# Patient Record
Sex: Male | Born: 1970 | Race: White | Hispanic: No | Smoking: Former smoker
Health system: Southern US, Community
[De-identification: ages and names within clinical notes are randomized; demographics above are authoritative.]

## PROBLEM LIST (undated history)

## (undated) ENCOUNTER — Ambulatory Visit: Disposition: A | Discharge status: Home / Self Care | Payer: BLUE CROSS/BLUE SHIELD

## (undated) DIAGNOSIS — E162 Hypoglycemia, unspecified: Secondary | ICD-10-CM

## (undated) DIAGNOSIS — F411 Generalized anxiety disorder: Secondary | ICD-10-CM

## (undated) DIAGNOSIS — G4730 Sleep apnea, unspecified: Secondary | ICD-10-CM

## (undated) HISTORY — DX: Generalized anxiety disorder: F41.1

## (undated) HISTORY — DX: Sleep apnea, unspecified: G47.30

## (undated) HISTORY — DX: Hypoglycemia, unspecified: E16.2

---

## 2019-07-14 ENCOUNTER — Other Ambulatory Visit: Payer: Self-pay

## 2019-07-14 DIAGNOSIS — Z20822 Contact with and (suspected) exposure to covid-19: Secondary | ICD-10-CM

## 2019-07-16 LAB — NOVEL CORONAVIRUS, NAA: SARS-CoV-2, NAA: NOT DETECTED

## 2019-09-29 ENCOUNTER — Emergency Department: Payer: BLUE CROSS/BLUE SHIELD

## 2019-09-29 ENCOUNTER — Other Ambulatory Visit: Payer: Self-pay

## 2019-09-29 ENCOUNTER — Encounter: Payer: Self-pay | Admitting: Emergency Medicine

## 2019-09-29 ENCOUNTER — Emergency Department
Admission: EM | Admit: 2019-09-29 | Discharge: 2019-09-29 | Disposition: A | Discharge status: Home / Self Care | Payer: BLUE CROSS/BLUE SHIELD | Attending: Emergency Medicine | Admitting: Emergency Medicine

## 2019-09-29 DIAGNOSIS — R531 Weakness: Secondary | ICD-10-CM | POA: Insufficient documentation

## 2019-09-29 DIAGNOSIS — R42 Dizziness and giddiness: Secondary | ICD-10-CM | POA: Diagnosis not present

## 2019-09-29 LAB — CBC
HCT: 45 % (ref 39.0–52.0)
Hemoglobin: 15.3 g/dL (ref 13.0–17.0)
MCH: 31.1 pg (ref 26.0–34.0)
MCHC: 34 g/dL (ref 30.0–36.0)
MCV: 91.5 fL (ref 80.0–100.0)
Platelets: 209 10*3/uL (ref 150–400)
RBC: 4.92 MIL/uL (ref 4.22–5.81)
RDW: 13 % (ref 11.5–15.5)
WBC: 6.1 10*3/uL (ref 4.0–10.5)
nRBC: 0 % (ref 0.0–0.2)

## 2019-09-29 LAB — URINALYSIS, COMPLETE (UACMP) WITH MICROSCOPIC
Bacteria, UA: NONE SEEN
Bilirubin Urine: NEGATIVE
Glucose, UA: NEGATIVE mg/dL
Hgb urine dipstick: NEGATIVE
Ketones, ur: NEGATIVE mg/dL
Leukocytes,Ua: NEGATIVE
Nitrite: NEGATIVE
Protein, ur: NEGATIVE mg/dL
Specific Gravity, Urine: 1.009 (ref 1.005–1.030)
Squamous Epithelial / HPF: NONE SEEN (ref 0–5)
WBC, UA: NONE SEEN WBC/hpf (ref 0–5)
pH: 7 (ref 5.0–8.0)

## 2019-09-29 LAB — BASIC METABOLIC PANEL
Anion gap: 9 (ref 5–15)
BUN: 19 mg/dL (ref 6–20)
CO2: 24 mmol/L (ref 22–32)
Calcium: 9.8 mg/dL (ref 8.9–10.3)
Chloride: 107 mmol/L (ref 98–111)
Creatinine, Ser: 1.1 mg/dL (ref 0.61–1.24)
GFR calc Af Amer: >60 mL/min (ref >60)
GFR calc non Af Amer: >60 mL/min (ref >60)
Glucose, Bld: 104 mg/dL — ABNORMAL HIGH (ref 70–99)
Potassium: 3.6 mmol/L (ref 3.5–5.1)
Sodium: 140 mmol/L (ref 135–145)

## 2019-09-29 LAB — GLUCOSE, CAPILLARY: Glucose-Capillary: 105 mg/dL — ABNORMAL HIGH (ref 70–99)

## 2019-09-29 LAB — TROPONIN I (HIGH SENSITIVITY): Troponin I (High Sensitivity): <2 ng/L (ref <18)

## 2019-09-29 MED ORDER — LACTATED RINGERS IV BOLUS
1000.0000 mL | Freq: Once | INTRAVENOUS | Status: AC
  Administered 2019-09-29: 1000 mL via INTRAVENOUS

## 2019-09-29 NOTE — ED Triage Notes (Signed)
Pt reports hx of diabetes and this am felt weak, dizzy and his blood sugar was reading low. Pt does not recall what it was and did not bring his meter.

## 2019-09-29 NOTE — ED Notes (Signed)
Pt given ice pack for swelling as line infiltrated. Peripheral IV removed.

## 2019-09-29 NOTE — ED Notes (Signed)
Pt discharged home after verbalizing understanding of discharge instructions; nad noted. 

## 2019-09-29 NOTE — ED Provider Notes (Signed)
[facility] Emergency Department Provider Note   ____________________________________________   First MD Initiated Contact with Patient 09/29/19 [phone]     (approximate)  I have reviewed the triage vital signs and the nursing notes.   HISTORY  Chief Complaint Weakness, Dizziness, and Hypoglycemia    HPI Blake Buchanan is a 48 y.o. male with past medical history of prediabetes and anxiety presents to the ED complaining of weakness and dizziness.  Patient reports that he has been having intermittent episodes of feeling lightheaded, weak, and dizzy over the past couple of days.  Episodes do not seem to be provoked by anything in particular, but he is concerned that he has having "sugar crashes".  He states that he previously has been told he is prediabetic and is not currently taking any medication for it.  He describes numerous similar episodes when he was living in [place], which he was evaluated for and told his blood sugar was normal.  He attempted to eat something this morning but had no improvement in his symptoms.  He states he is otherwise been feeling well recently with no fevers, cough, chest pain, shortness of breath, abdominal pain, dysuria, or hematuria.  He does not currently take any medications.        Past Medical History:  Diagnosis Date  . Diabetes mellitus without complication ([place])     There are no active problems to display for this patient.   History reviewed. No pertinent surgical history.  Prior to Admission medications   Not on File    Allergies Patient has no allergy information on record.  No family history on file.  Social History Social History   Tobacco Use  . Smoking status: Not on file  Substance Use Topics  . Alcohol use: Not on file  . Drug use: Not on file    Review of Systems  Constitutional: No fever/chills Eyes: No visual changes. ENT: No sore throat. Cardiovascular: Denies chest pain.  Respiratory: Denies shortness of breath. Gastrointestinal: No abdominal pain.  No nausea, no vomiting.  No diarrhea.  No constipation. Genitourinary: Negative for dysuria. Musculoskeletal: Negative for back pain. Skin: Negative for rash. Neurological: Negative for headaches, focal weakness or numbness.  Positive for lightheadedness and dizziness.  ____________________________________________   PHYSICAL EXAM:  VITAL SIGNS: ED Triage Vitals  Enc Vitals Group     BP 09/29/19 0923 (!) 105/58     Pulse Rate 09/29/19 0923 75     Resp --      Temp 09/29/19 0923 98.1 F (36.7 C)     Temp Source 09/29/19 0923 Oral     SpO2 09/29/19 0923 97 %     Weight 09/29/19 0920 220 lb (99.8 kg)     Height 09/29/19 0920 5\' 4"  (1.626 m)     Head Circumference --      Peak Flow --      Pain Score 09/29/19 0920 0     Pain Loc --      Pain Edu? --      Excl. in [place]? --     Constitutional: Alert and oriented. Eyes: Conjunctivae are normal. Head: Atraumatic. Nose: No congestion/rhinnorhea. Mouth/Throat: Mucous membranes are moist. Neck: Normal ROM Cardiovascular: Normal rate, regular rhythm. Grossly normal heart sounds. Respiratory: Normal respiratory effort.  No retractions. Lungs CTAB. Gastrointestinal: Soft and nontender. No distention. Genitourinary: deferred Musculoskeletal: No lower extremity tenderness nor edema. Neurologic:  Normal speech and language. No gross focal neurologic deficits are appreciated. Skin:  Skin is warm, dry and intact. No rash noted. Psychiatric: Mood and affect are normal. Speech and behavior are normal.  ____________________________________________   LABS (all labs ordered are listed, but only abnormal results are displayed)  Labs Reviewed  BASIC METABOLIC PANEL - Abnormal; Notable for the following components:      Result Value   Glucose, Bld 104 (*)    All other components within normal limits  URINALYSIS, COMPLETE (UACMP) WITH MICROSCOPIC - Abnormal;  Notable for the following components:   Color, Urine STRAW (*)    APPearance CLEAR (*)    All other components within normal limits  GLUCOSE, CAPILLARY - Abnormal; Notable for the following components:   Glucose-Capillary 105 (*)    All other components within normal limits  CBC  CBG MONITORING, ED  TROPONIN I (HIGH SENSITIVITY)  TROPONIN I (HIGH SENSITIVITY)   ____________________________________________  EKG  ED ECG REPORT I, Chesley Noon, the attending physician, personally viewed and interpreted this ECG.   Date: 09/29/2019  EKG Time: 9:33  Rate: 71  Rhythm: normal sinus rhythm  Axis: Normal  Intervals:none  ST&T Change: ST elevation consistent with benign early repol   PROCEDURES  Procedure(s) performed (including Critical Care):  Procedures   ____________________________________________   INITIAL IMPRESSION / ASSESSMENT AND PLAN / ED COURSE       48 year old male with history of prediabetes and anxiety presents to the ED with intermittent episodes of lightheadedness and generalized weakness over the past couple of weeks.  He describes these episodes as "sugar crashes" but states he only has a history of prediabetes and does not currently take any medications for glycemic control.  Fingerstick glucose was checked here and is normal, will check labs, chest x-ray, and UA.  EKG without acute ischemic changes or evidence of arrhythmia, will continue to monitor patient on heart monitor.  Lab work is unremarkable, no evidence of infection on chest x-ray or UA.  Patient feeling better following IV fluid bolus, will discharge home with plan for PCP follow-up.  Counseled patient to return to the ED for new or worsening symptoms, patient agrees with plan.      ____________________________________________   FINAL CLINICAL IMPRESSION(S) / ED DIAGNOSES  Final diagnoses:  Lightheadedness  Generalized weakness     ED Discharge Orders    None       Note:  This  document was prepared using Dragon voice recognition software and may include unintentional dictation errors.   Chesley Noon, MD 09/29/19 1544

## 2019-09-29 NOTE — ED Notes (Signed)
Pt denies pain to infiltration site.

## 2019-11-16 ENCOUNTER — Ambulatory Visit: Payer: BLUE CROSS/BLUE SHIELD | Attending: Internal Medicine

## 2019-11-16 DIAGNOSIS — Z20822 Contact with and (suspected) exposure to covid-19: Secondary | ICD-10-CM

## 2019-11-17 LAB — NOVEL CORONAVIRUS, NAA: SARS-CoV-2, NAA: NOT DETECTED

## 2019-11-22 ENCOUNTER — Ambulatory Visit
Admission: EM | Admit: 2019-11-22 | Discharge: 2019-11-22 | Disposition: A | Discharge status: Home / Self Care | Payer: 59 | Attending: Emergency Medicine | Admitting: Emergency Medicine

## 2019-11-22 ENCOUNTER — Encounter: Payer: Self-pay | Admitting: Emergency Medicine

## 2019-11-22 DIAGNOSIS — R300 Dysuria: Secondary | ICD-10-CM | POA: Insufficient documentation

## 2019-11-22 DIAGNOSIS — Z202 Contact with and (suspected) exposure to infections with a predominantly sexual mode of transmission: Secondary | ICD-10-CM | POA: Diagnosis present

## 2019-11-22 LAB — POCT URINALYSIS DIP (MANUAL ENTRY)
Bilirubin, UA: NEGATIVE
Blood, UA: NEGATIVE
Glucose, UA: NEGATIVE mg/dL
Ketones, POC UA: NEGATIVE mg/dL
Leukocytes, UA: NEGATIVE
Nitrite, UA: NEGATIVE
Protein Ur, POC: NEGATIVE mg/dL
Spec Grav, UA: 1.025 (ref 1.010–1.025)
Urobilinogen, UA: 0.2 E.U./dL
pH, UA: 7 (ref 5.0–8.0)

## 2019-11-22 NOTE — ED Notes (Signed)
Patient in office today c/o frequent urination,odor and burning after urinating. Since 11/15/19.  

## 2019-11-22 NOTE — ED Provider Notes (Signed)
[name]    CSN: [id number] Arrival date & time: 11/22/19  1056      History   Chief Complaint Chief Complaint  Patient presents with  . Urinary Tract Infection    HPI Blake Buchanan is a 49 y.o. male.   Patient presents with dysuria, malodorous urine, and frequency x 2 days.  He denies fever, chills, abdominal pain, back pain, penile discharge, testicular pain, lesions, rash, or other symptoms.  No treatments attempted at home.  Patient states he has not been sexually active for >2 months.    The history is provided by the patient.    Past Medical History:  Diagnosis Date  . Diabetes mellitus without complication (HCC)     There are no problems to display for this patient.   History reviewed. No pertinent surgical history.     Home Medications    Prior to Admission medications   Medication Sig Start Date End Date Taking? Authorizing Provider  omeprazole (PRILOSEC) 40 MG capsule Take 40 mg by mouth daily. 10/07/19   [provider]    Family History History reviewed. No pertinent family history.  Social History Social History   Tobacco Use  . Smoking status: Never Smoker  . Smokeless tobacco: Never Used  Substance Use Topics  . Alcohol use: Not on file  . Drug use: Not on file     Allergies   Patient has no known allergies.   Review of Systems Review of Systems  Constitutional: Negative for chills and fever.  HENT: Negative for ear pain and sore throat.   Eyes: Negative for pain and visual disturbance.  Respiratory: Negative for cough and shortness of breath.   Cardiovascular: Negative for chest pain and palpitations.  Gastrointestinal: Negative for abdominal pain and vomiting.  Genitourinary: Positive for dysuria and frequency. Negative for discharge, hematuria and testicular pain.  Musculoskeletal: Negative for arthralgias and back pain.  Skin: Negative for color change and rash.  Neurological: Negative for seizures  and syncope.  All other systems reviewed and are negative.    Physical Exam Triage Vital Signs ED Triage Vitals  Enc Vitals Group     BP      Pulse      Resp      Temp      Temp src      SpO2      Weight      Height      Head Circumference      Peak Flow      Pain Score      Pain Loc      Pain Edu?      Excl. in [place]?    No data found.  Updated Vital Signs BP 108/74 (BP Location: Left Arm)   Pulse 79   Temp 98.7 F (37.1 C) (Oral)   Resp 18   SpO2 96%   Visual Acuity Right Eye Distance:   Left Eye Distance:   Bilateral Distance:    Right Eye Near:   Left Eye Near:    Bilateral Near:     Physical Exam Vitals and nursing note reviewed.  Constitutional:      Appearance: He is well-developed.  HENT:     Head: Normocephalic and atraumatic.     Mouth/Throat:     Mouth: Mucous membranes are moist.  Eyes:     Conjunctiva/sclera: Conjunctivae normal.  Cardiovascular:     Rate and Rhythm: Normal rate and regular rhythm.     Heart  sounds: No murmur.  Pulmonary:     Effort: Pulmonary effort is normal. No respiratory distress.     Breath sounds: Normal breath sounds.  Abdominal:     General: Bowel sounds are normal.     Palpations: Abdomen is soft.     Tenderness: There is no abdominal tenderness. There is no right CVA tenderness, left CVA tenderness, guarding or rebound.  Genitourinary:    Penis: Normal.      Testes: Normal.  Musculoskeletal:     Cervical back: Neck supple.  Skin:    General: Skin is warm and dry.  Neurological:     General: No focal deficit present.     Mental Status: He is alert and oriented to person, place, and time.  Psychiatric:        Mood and Affect: Mood normal.        Behavior: Behavior normal.      UC Treatments / Results  Labs (all labs ordered are listed, but only abnormal results are displayed) Labs Reviewed  POCT URINALYSIS DIP (MANUAL ENTRY)  CYTOLOGY, (ORAL, ANAL, URETHRAL) ANCILLARY ONLY     EKG   Radiology No results found.  Procedures Procedures (including critical care time)  Medications Ordered in UC Medications - No data to display  Initial Impression / Assessment and Plan / UC Course  I have reviewed the triage vital signs and the nursing notes.  Pertinent labs & imaging results that were available during my care of the patient were reviewed by me and considered in my medical decision making (see chart for details).    Dysuria, possible exposure to STD.  Urethral swab obtained.  Patient declines treatment today and states he will accept treatment if the STD test results come back positive.  Discussed with patient that we will call if if his STD test results are positive and that he and his sexual partners may require treatment at that time.  Instructed patient to refrain from sexual activity until the test results are back.  Patient agrees to plan of care.     Final Clinical Impressions(s) / UC Diagnoses   Final diagnoses:  Dysuria  Possible exposure to STD     Discharge Instructions     Do not have sex until your test results are back.    Your STD tests are pending.  If your test results are positive, we will call you.  You may need treatment and your partner(s) may also need treatment.          ED Prescriptions    None     PDMP not reviewed this encounter.   Sharion Balloon, NP 11/22/19 1212

## 2019-11-22 NOTE — ED Triage Notes (Signed)
Patient in office today c/o frequent urination,odor and burning after urinating. Since 11/15/19.

## 2019-11-22 NOTE — Discharge Instructions (Addendum)
Do not have sex until your test results are back.  Your STD tests are pending.  If your test results are positive, we will call you.  You may need treatment and your partner(s) may also need treatment.      

## 2019-11-23 LAB — CYTOLOGY, (ORAL, ANAL, URETHRAL) ANCILLARY ONLY
Chlamydia: NEGATIVE
Neisseria Gonorrhea: NEGATIVE
Trichomonas: NEGATIVE

## 2019-11-28 ENCOUNTER — Encounter: Payer: Self-pay | Admitting: Family

## 2019-11-28 ENCOUNTER — Ambulatory Visit (INDEPENDENT_AMBULATORY_CARE_PROVIDER_SITE_OTHER): Payer: 59 | Admitting: Family

## 2019-11-28 ENCOUNTER — Other Ambulatory Visit: Payer: Self-pay

## 2019-11-28 VITALS — HR 79 | Ht 70.0 in | Wt 222.0 lb

## 2019-11-28 DIAGNOSIS — F411 Generalized anxiety disorder: Secondary | ICD-10-CM

## 2019-11-28 DIAGNOSIS — G4733 Obstructive sleep apnea (adult) (pediatric): Secondary | ICD-10-CM

## 2019-11-28 DIAGNOSIS — R350 Frequency of micturition: Secondary | ICD-10-CM

## 2019-11-28 DIAGNOSIS — E161 Other hypoglycemia: Secondary | ICD-10-CM | POA: Insufficient documentation

## 2019-11-28 MED ORDER — ALPRAZOLAM 0.5 MG PO TABS: 0.5000 mg | ORAL_TABLET | Freq: Every evening | ORAL | 0 refills | Status: AC | PRN

## 2019-11-28 NOTE — Assessment & Plan Note (Addendum)
Etiology unknown. patient has labs done with prior PCP , he declines labs at this time.  Advised untreated OSA may contributory H/o prediabetes Referral to urology for further evaluation.

## 2019-11-28 NOTE — Assessment & Plan Note (Signed)
Advised of risks of not wearing CPAP machine.  Patient will get supplies

## 2019-11-28 NOTE — Progress Notes (Signed)
Virtual Visit via Video Note  I connected with@  on 11/28/19 at 11:00 AM EST by a video enabled telemedicine application and verified that I am speaking with the correct person using two identifiers.  Location patient: home Location provider:home office Persons participating in the virtual visit: patient, provider  I discussed the limitations of evaluation and management by telemedicine and the availability of in person appointments. The patient expressed understanding and agreed to proceed.   HPI: New patient, establish care.  From [facility] and moved here after lost job, divorce. 'Starting over. ' Living with his parents currently. Employed by [employer]  CC: urinary frequency x for past year, worsening.   Drinks 'nothing but water'. Concerned for urinary frequency, more notable in the evening. Every hour has to urinate.  No penile discharge, testicular pain or concern for STDs. Some urinary hesitancy. No constipation, straining with BM, pain with bowel movement. Had dysuria last week ( when seen at [facility])  which resolved with increase in water intake.  States that he Had CPE last year in [place] around 11/2018 with prostate exam which was normal.   Seen recently at ED, no STDs, infection.   OSA- formally diagnosed. Has cipap. No supplies now. Plans to get supplies.   H/o prediabetic and hypoglycemic episodes. Has a glucometer, will take it when 'feels low' . About a week ago, was 21. Ate sugar and it improved.  Has to eat every couple of hours. Thinks related to anxiety. Carries glucose tablets. 'feels like anxiety is related'. Has seen endocrine for this in [place].   H/o GAD- doesn't want an SSRI due to sexual side effects. Xanax very prn , works well. Same bottle of xanax for over a year.  Breathing exercises and meditation, CBT helpful.  No si/hi. 'suicide is not an option.' No suicide plan. No self harm.  Suicide attempt when in highschool with aspirin.   ROS: See pertinent positives and negatives  per HPI.  Past Medical History:  Diagnosis Date  . GAD (generalized anxiety disorder)   . Hypoglycemia   . Sleep apnea     History reviewed. No pertinent surgical history.  Family History  Problem Relation Age of Onset  . Hyperlipidemia Mother   . Asthma Father   . Hyperlipidemia Father   . Parkinson's disease Maternal Grandmother   . Diabetes Maternal Grandfather   . Hypertension Maternal Grandfather   . Hypertension Paternal Grandfather   . Prostate cancer Neg Hx   . Colon cancer Neg Hx     SOCIAL HX: former smoker   Current Outpatient Medications:  .  ALPRAZolam (XANAX) 0.5 MG tablet, Take 1 tablet (0.5 mg total) by mouth at bedtime as needed for anxiety., Disp: 30 tablet, Rfl: 0 .  omeprazole (PRILOSEC) 40 MG capsule, Take 40 mg by mouth daily., Disp: , Rfl:   EXAM:  VITALS per patient if applicable:  GENERAL: alert, oriented, appears well and in no acute distress  HEENT: atraumatic, conjunttiva clear, no obvious abnormalities on inspection of external nose and ears  NECK: normal movements of the head and neck  LUNGS: on inspection no signs of respiratory distress, breathing rate appears normal, no obvious gross SOB, gasping or wheezing  CV: no obvious cyanosis  MS: moves all visible extremities without noticeable abnormality  PSYCH/NEURO: pleasant and cooperative, no obvious depression or anxiety, speech and thought processing grossly intact  ASSESSMENT AND PLAN:  Discussed the following assessment and plan:  GAD (generalized anxiety disorder) - Plan: [place] counseling- Ambulatory  referral to Psychology, ALPRAZolam Prudy Feeler) 0.5 MG tablet  Urinary frequency - Plan: Ambulatory referral to Urology  Hypoglycemic reaction - Plan: Referral to Nutrition and Diabetes Services  OSA (obstructive sleep apnea) Problem List Items Addressed This Visit      Respiratory   OSA (obstructive sleep apnea)    Advised of risks of not wearing CPAP machine.  Patient  will get supplies        Digestive   Hypoglycemic reaction    Etiology nonspecific at this time.  Patient feels anxiety is contributory .patient has history of prediabetes.  He carries glucose tablets and is working on eating more frequently advised CGM.  Declines endocrine consult at this time. Advised nutrition consult and frequent meals which include protein and carbohydrate to stabilize his blood glucose.  Close follow up.       Relevant Orders   Referral to Nutrition and Diabetes Services     Other   GAD (generalized anxiety disorder) - Primary    Controlled.  He is hesitant to trial an SSRI at this time due to sexual side effects .  He feels well on current regimen although is interested in resuming counseling with CBT.  This referral is in place for him today.  Long discussion regards the risk of benzodiazepines particularly overdose, addiction and cognitive decline.  We jointly agreed that he will  using very sparingly and would continue to do so.  If at any point he feels he is having to use daily, he understands let me know so we can discuss a safer daily medication.  No si or suicide plan. I looked up patient on [place] Controlled Substances Reporting System in [place] and saw no activity that raised concern of inappropriate use.  He will complete a controlled substance form when he comes in the office.  He understands to follow-up every 3 months       Relevant Medications   ALPRAZolam (XANAX) 0.5 MG tablet   Other Relevant Orders   [place] counseling- Ambulatory referral to Psychology   Urinary frequency    Etiology unknown. patient has labs done with prior PCP , he declines labs at this time.  Advised untreated OSA may contributory H/o prediabetes Referral to urology for further evaluation.        Relevant Orders   Ambulatory referral to Urology      -we discussed possible serious and likely etiologies, options for evaluation and workup, limitations of telemedicine visit  vs in person visit, treatment, treatment risks and precautions. Pt prefers to treat via telemedicine empirically rather then risking or undertaking an in person visit at this moment. Patient agrees to seek prompt in person care if worsening, new symptoms arise, or if is not improving with treatment.   I discussed the assessment and treatment plan with the patient. The patient was provided an opportunity to ask questions and all were answered. The patient agreed with the plan and demonstrated an understanding of the instructions.   The patient was advised to call back or seek an in-person evaluation if the symptoms worsen or if the condition fails to improve as anticipated.   Rennie Plowman, FNP

## 2019-11-28 NOTE — Assessment & Plan Note (Signed)
Controlled.  He is hesitant to trial an SSRI at this time due to sexual side effects .  He feels well on current regimen although is interested in resuming counseling with CBT.  This referral is in place for him today.  Long discussion regards the risk of benzodiazepines particularly overdose, addiction and cognitive decline.  We jointly agreed that he will  using very sparingly and would continue to do so.  If at any point he feels he is having to use daily, he understands let me know so we can discuss a safer daily medication.  No si or suicide plan. I looked up patient on [place] Controlled Substances Reporting System in [place] and saw no activity that raised concern of inappropriate use.  He will complete a controlled substance form when he comes in the office.  He understands to follow-up every 3 months

## 2019-11-28 NOTE — Assessment & Plan Note (Addendum)
Etiology nonspecific at this time.  Patient feels anxiety is contributory .patient has history of prediabetes.  He carries glucose tablets and is working on eating more frequently advised CGM.  Declines endocrine consult at this time. Advised nutrition consult and frequent meals which include protein and carbohydrate to stabilize his blood glucose.  Close follow up.

## 2019-12-28 ENCOUNTER — Encounter: Payer: Self-pay | Admitting: Urology

## 2019-12-28 ENCOUNTER — Ambulatory Visit: Payer: 59 | Admitting: Urology

## 2019-12-28 ENCOUNTER — Other Ambulatory Visit: Payer: Self-pay

## 2019-12-28 VITALS — BP 123/82 | HR 73 | Ht 70.0 in | Wt 226.0 lb

## 2019-12-28 DIAGNOSIS — R350 Frequency of micturition: Secondary | ICD-10-CM | POA: Diagnosis not present

## 2019-12-28 LAB — URINALYSIS, COMPLETE
Bilirubin, UA: NEGATIVE
Glucose, UA: NEGATIVE
Ketones, UA: NEGATIVE
Leukocytes,UA: NEGATIVE
Nitrite, UA: NEGATIVE
Protein,UA: NEGATIVE
RBC, UA: NEGATIVE
Specific Gravity, UA: 1.025 (ref 1.005–1.030)
Urobilinogen, Ur: 0.2 mg/dL (ref 0.2–1.0)
pH, UA: 5.5 (ref 5.0–7.5)

## 2019-12-28 LAB — MICROSCOPIC EXAMINATION
Bacteria, UA: NONE SEEN
Epithelial Cells (non renal): NONE SEEN /hpf (ref 0–10)
RBC, Urine: NONE SEEN /hpf (ref 0–2)
WBC, UA: NONE SEEN /hpf (ref 0–5)

## 2019-12-28 LAB — BLADDER SCAN AMB NON-IMAGING

## 2019-12-28 MED ORDER — MIRABEGRON ER 50 MG PO TB24: 50.0000 mg | ORAL_TABLET | Freq: Every day | ORAL | 0 refills | Status: AC

## 2019-12-28 NOTE — Addendum Note (Signed)
Addended by: Frankey Shown on: 12/28/2019 02:04 PM   Modules accepted: Orders

## 2019-12-28 NOTE — Patient Instructions (Signed)

## 2019-12-28 NOTE — Progress Notes (Signed)
12/28/19 1:51 PM   Blake Buchanan Dec 06, 1970 [id number]  Referring provider: Alan Mulder, MD [address] [place],  [place] [address]  CC: Urinary frequency  HPI: I saw Blake Buchanan in urology clinic today for evaluation of urinary frequency.  He is a 49 year old male with history of anxiety who reports worsening urinary frequency and urgency over the last year.  He has had extensive work-up with multiple urine samples and STD testing which have all been negative.  He has also been drinking large amounts of fluids with at least 8 bottles of water a day because he was told to increase his fluids by urgent care.  He denies any gross hematuria, dysuria, abdominal, or flank pain.  He denies any history of retention or UTIs.  There is no family history of prostate cancer.  He is also had a very stressful year as he lost his job in [place] secondary to [name] and is also going through a divorce.  Urinalysis today is completely benign with 0 WBCs, 0 RBCs, no bacteria, nitrite negative, no leukocytes.  PVR is normal at 15 mL.   PMH: Past Medical History:  Diagnosis Date  . GAD (generalized anxiety disorder)   . Hypoglycemia   . Sleep apnea     Surgical History: No past surgical history on file.  Allergies: No Known Allergies  Family History: Family History  Problem Relation Age of Onset  . Hyperlipidemia Mother   . Asthma Father   . Hyperlipidemia Father   . Parkinson's disease Maternal Grandmother   . Diabetes Maternal Grandfather   . Hypertension Maternal Grandfather   . Hypertension Paternal Grandfather   . Prostate cancer Neg Hx   . Colon cancer Neg Hx     Social History:  reports that he has quit smoking. He has never used smokeless tobacco. He reports current alcohol use. He reports that he does not use drugs.  ROS: Please see flowsheet from today's date for complete review of systems.  Physical Exam: BP 123/82 (BP Location: Left Arm, Patient Position:  Sitting, Cuff Size: Large)   Pulse 73   Ht 5\' 10"  (1.778 m)   Wt 226 lb (102.5 kg)   BMI 32.43 kg/m    Constitutional:  Alert and oriented, No acute distress. Cardiovascular: No clubbing, cyanosis, or edema. Respiratory: Normal respiratory effort, no increased work of breathing. GI: Abdomen is soft, nontender, nondistended, no abdominal masses GU: Circumcised phallus with widely patent meatus, testicles descended and 20 cc bilaterally Lymph: No cervical or inguinal lymphadenopathy. Skin: No rashes, bruises or suspicious lesions. Neurologic: Grossly intact, no focal deficits, moving all 4 extremities. Psychiatric: Normal mood and affect.  Laboratory Data: Reviewed, see HPI  Assessment & Plan:   In summary, he is a 49 year old male with medical history notable for anxiety who has had worsening urinary frequency over the last year.  His urinalysis is benign and he is emptying his bladder well with a PVR of 13 mL.  We had a long conversation today that I highly suspect the etiology of his urinary symptoms are behavioral with his increased fluid intake, coffee, and tea, as well as his anxiety and stressful life situation with recent job loss and divorce.  We discussed avoiding bladder irritants like coffee and tea, drinking appropriate amount of fluids when thirsty, and timed and double voiding before bed.  As he transitions to these behavioral strategies, I also recommended a 1 month course of Myrbetriq 50 mg daily to help ease his transition  and improve his urinary frequency.  Behavioral strategies discussed at length 1 month trial of Myrbetriq, samples given RTC for virtual visit 1 month  I spent 30 minutes on the day of the encounter including pre-visit review of the medical record, face-to-face time with the patient, and post visit ordering of labs/imaging/tests.  Nickolas Madrid, MD 12/28/2019  [facility] [address], [place], [place]  [address] [phone]

## 2020-02-02 ENCOUNTER — Telehealth: Payer: 59 | Admitting: Urology

## 2020-02-27 ENCOUNTER — Ambulatory Visit: Payer: 59 | Admitting: Family

## 2020-06-18 IMAGING — CR DG CHEST 2V
2 series · 2 of 2 positions shown · non-contrast
Comparison: None.

CLINICAL DATA: Pt reports hx of diabetes and this am felt weak,
dizzy and his blood sugar was reading low; states his legs felt very
weak and he was trying to [employer] eggs and couldn't remember how;

EXAM:
CHEST - 2 VIEW

[chest pa]
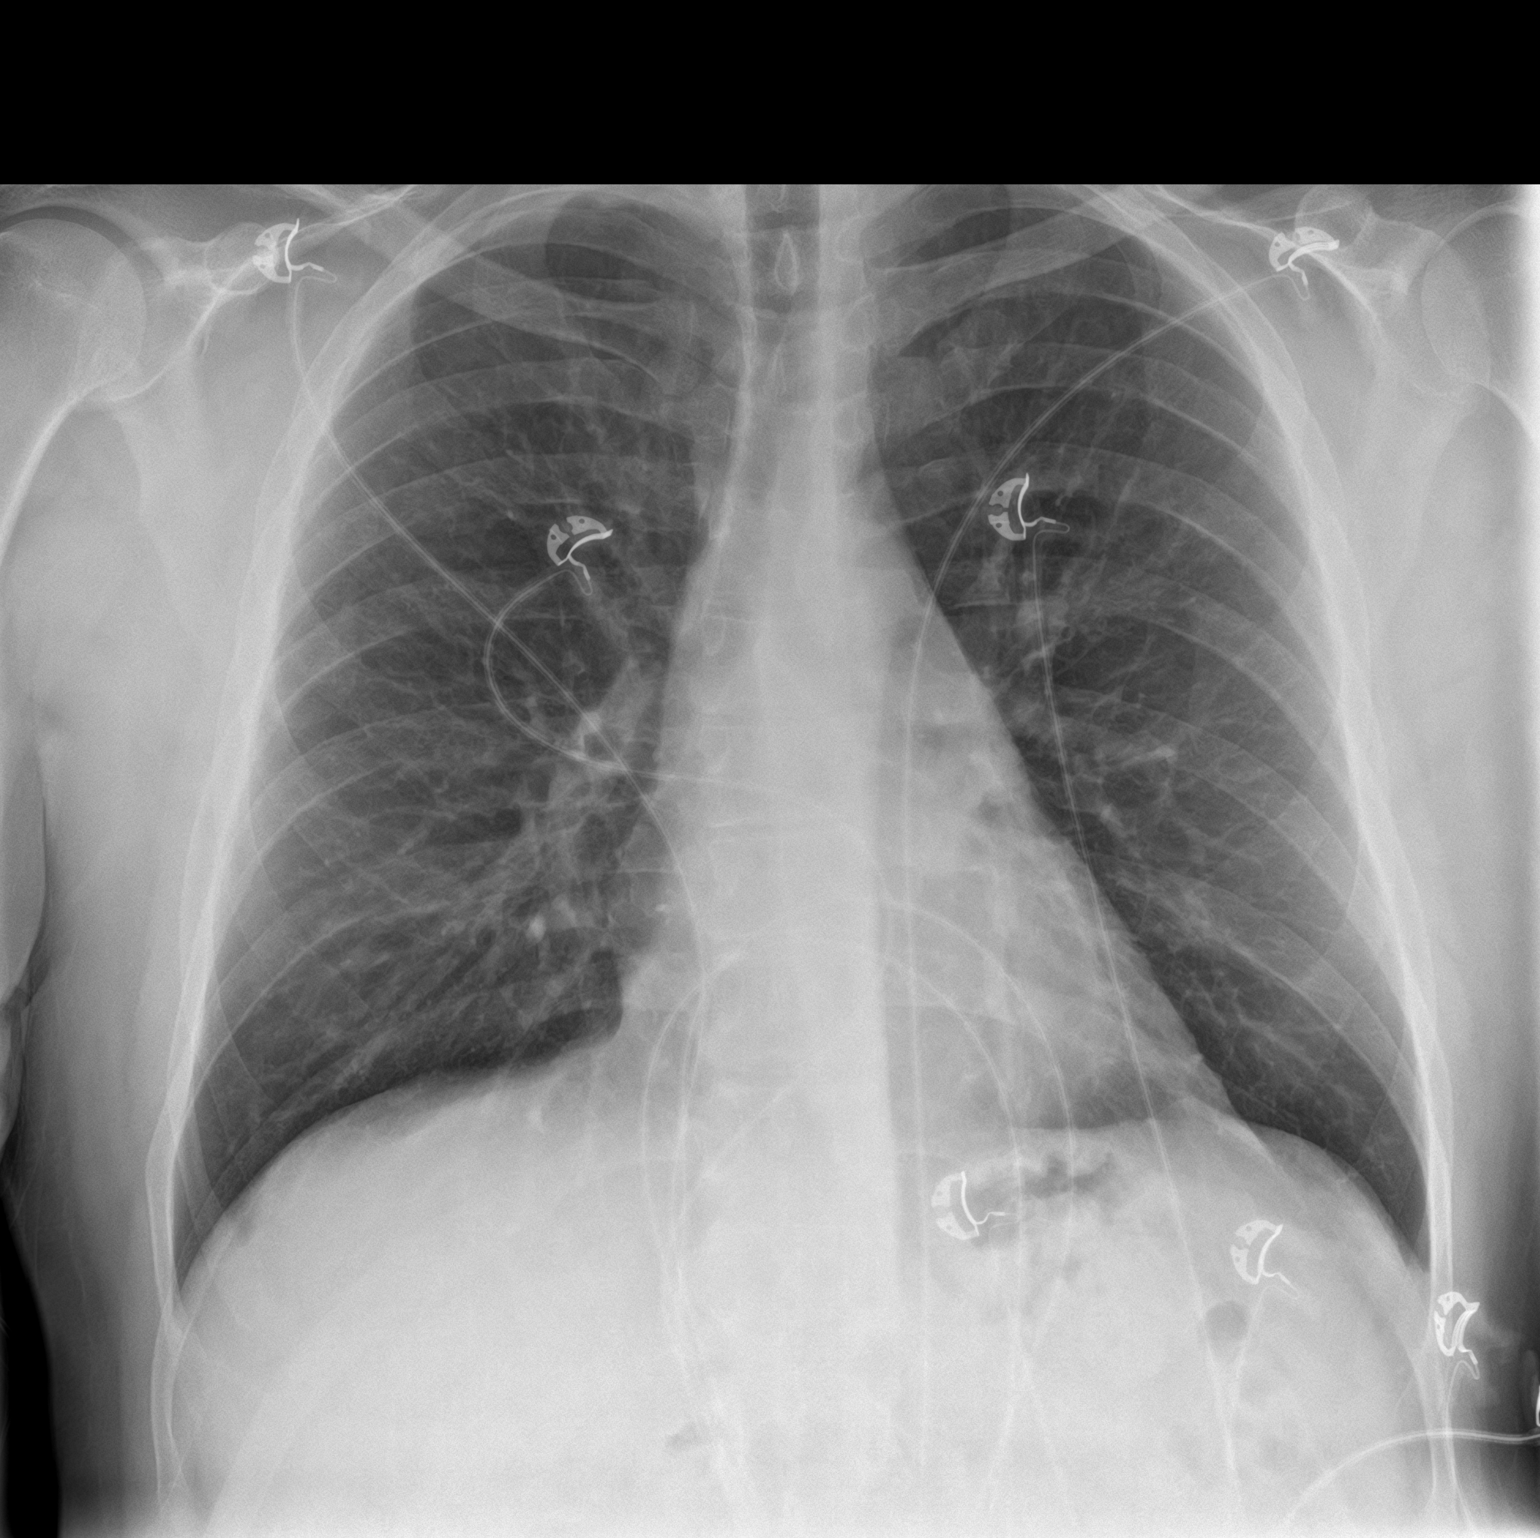

[chest lat]
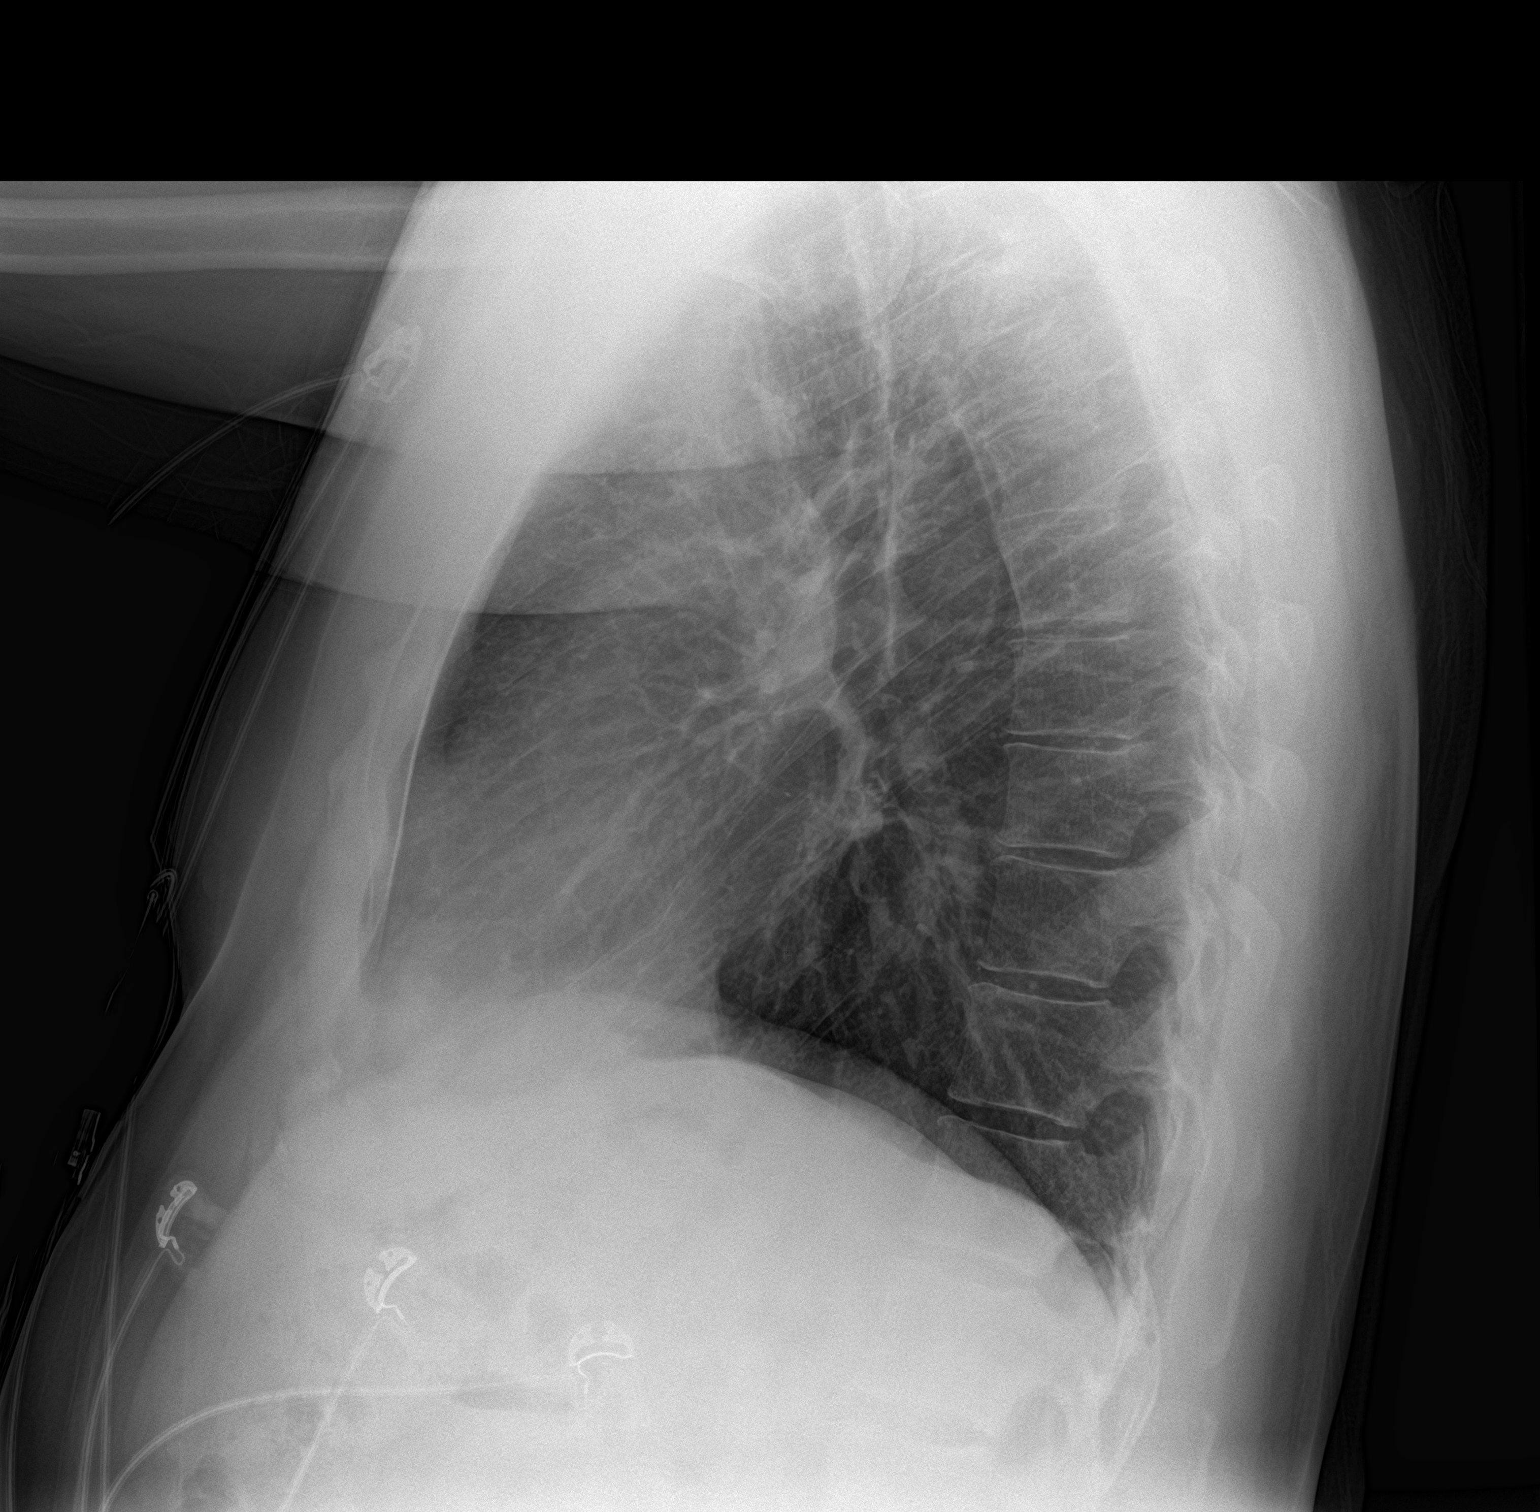

[2 of 2 positions shown; findings below may reference images not displayed]

FINDINGS: The heart size and mediastinal contours are within normal limits.
Both lungs are clear. No pleural effusion or pneumothorax. The
visualized skeletal structures are unremarkable.
IMPRESSION: No active cardiopulmonary disease.
